# Patient Record
Sex: Female | Born: 1995 | Race: White | Hispanic: No | Marital: Single | Smoking: Never smoker
Health system: Southern US, Community
[De-identification: ages and names within clinical notes are randomized; demographics above are authoritative.]

## PROBLEM LIST (undated history)

## (undated) DIAGNOSIS — F32A Depression, unspecified: Secondary | ICD-10-CM

## (undated) DIAGNOSIS — F329 Major depressive disorder, single episode, unspecified: Secondary | ICD-10-CM

## (undated) DIAGNOSIS — R569 Unspecified convulsions: Secondary | ICD-10-CM

---
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
## 2017-03-01 ENCOUNTER — Emergency Department: Payer: 59

## 2017-03-01 ENCOUNTER — Observation Stay
Admission: EM | Admit: 2017-03-01 | Discharge: 2017-03-02 | Disposition: A | Discharge status: Home / Self Care | Payer: 59 | Attending: Internal Medicine | Admitting: Internal Medicine

## 2017-03-01 ENCOUNTER — Other Ambulatory Visit: Payer: Self-pay

## 2017-03-01 ENCOUNTER — Encounter: Payer: Self-pay | Admitting: Emergency Medicine

## 2017-03-01 DIAGNOSIS — F329 Major depressive disorder, single episode, unspecified: Secondary | ICD-10-CM | POA: Insufficient documentation

## 2017-03-01 DIAGNOSIS — R569 Unspecified convulsions: Secondary | ICD-10-CM | POA: Diagnosis not present

## 2017-03-01 DIAGNOSIS — S0990XA Unspecified injury of head, initial encounter: Secondary | ICD-10-CM | POA: Diagnosis not present

## 2017-03-01 DIAGNOSIS — Z72820 Sleep deprivation: Secondary | ICD-10-CM | POA: Diagnosis not present

## 2017-03-01 DIAGNOSIS — Z79899 Other long term (current) drug therapy: Secondary | ICD-10-CM | POA: Diagnosis not present

## 2017-03-01 DIAGNOSIS — Y9301 Activity, walking, marching and hiking: Secondary | ICD-10-CM | POA: Insufficient documentation

## 2017-03-01 HISTORY — DX: Unspecified convulsions: R56.9

## 2017-03-01 LAB — URINE DRUG SCREEN, QUALITATIVE (ARMC ONLY)
Amphetamines, Ur Screen: NOT DETECTED
BARBITURATES, UR SCREEN: NOT DETECTED
Benzodiazepine, Ur Scrn: NOT DETECTED
CANNABINOID 50 NG, UR ~~LOC~~: NOT DETECTED
COCAINE METABOLITE, UR ~~LOC~~: NOT DETECTED
MDMA (ECSTASY) UR SCREEN: NOT DETECTED
Methadone Scn, Ur: NOT DETECTED
OPIATE, UR SCREEN: NOT DETECTED
Phencyclidine (PCP) Ur S: NOT DETECTED
Tricyclic, Ur Screen: NOT DETECTED

## 2017-03-01 LAB — BASIC METABOLIC PANEL
ANION GAP: 12 (ref 5–15)
BUN: 10 mg/dL (ref 6–20)
CALCIUM: 9.2 mg/dL (ref 8.9–10.3)
CHLORIDE: 104 mmol/L (ref 101–111)
CO2: 21 mmol/L — AB (ref 22–32)
Creatinine, Ser: 0.8 mg/dL (ref 0.44–1.00)
GFR calc Af Amer: >60 mL/min (ref >60)
GFR calc non Af Amer: >60 mL/min (ref >60)
GLUCOSE: 102 mg/dL — AB (ref 65–99)
POTASSIUM: 3.9 mmol/L (ref 3.5–5.1)
Sodium: 137 mmol/L (ref 135–145)

## 2017-03-01 LAB — CBC
HEMATOCRIT: 37.1 % (ref 35.0–47.0)
HEMOGLOBIN: 12.9 g/dL (ref 12.0–16.0)
MCH: 32.1 pg (ref 26.0–34.0)
MCHC: 34.7 g/dL (ref 32.0–36.0)
MCV: 92.4 fL (ref 80.0–100.0)
Platelets: 242 10*3/uL (ref 150–440)
RBC: 4.02 MIL/uL (ref 3.80–5.20)
RDW: 12.9 % (ref 11.5–14.5)
WBC: 7.1 10*3/uL (ref 3.6–11.0)

## 2017-03-01 LAB — POCT PREGNANCY, URINE: Preg Test, Ur: NEGATIVE

## 2017-03-01 MED ORDER — FLUOXETINE HCL 20 MG PO CAPS
20.0000 mg | ORAL_CAPSULE | Freq: Every day | ORAL | Status: DC
  Administered 2017-03-02: 20 mg via ORAL
  Filled 2017-03-01 (×2): qty 1

## 2017-03-01 MED ORDER — LORAZEPAM 2 MG/ML IJ SOLN
2.0000 mg | INTRAMUSCULAR | Status: DC | PRN
  Administered 2017-03-01 – 2017-03-02 (×3): 2 mg via INTRAVENOUS
  Filled 2017-03-01 (×3): qty 1

## 2017-03-01 MED ORDER — ACETAMINOPHEN 325 MG PO TABS
650.0000 mg | ORAL_TABLET | Freq: Once | ORAL | Status: AC
  Administered 2017-03-01: 650 mg via ORAL
  Filled 2017-03-01: qty 2

## 2017-03-01 MED ORDER — GADOBENATE DIMEGLUMINE 529 MG/ML IV SOLN
10.0000 mL | Freq: Once | INTRAVENOUS | Status: AC | PRN
  Administered 2017-03-01: 10 mL via INTRAVENOUS

## 2017-03-01 MED ORDER — HEPARIN SODIUM (PORCINE) 5000 UNIT/ML IJ SOLN
5000.0000 [IU] | Freq: Three times a day (TID) | INTRAMUSCULAR | Status: DC
  Filled 2017-03-01: qty 1

## 2017-03-01 MED ORDER — ONDANSETRON 4 MG PO TBDP
4.0000 mg | ORAL_TABLET | Freq: Once | ORAL | Status: AC
  Administered 2017-03-01: 4 mg via ORAL
  Filled 2017-03-01: qty 1

## 2017-03-01 MED ORDER — DOCUSATE SODIUM 100 MG PO CAPS: 100.0000 mg | ORAL_CAPSULE | Freq: Two times a day (BID) | ORAL | Status: DC | PRN

## 2017-03-01 NOTE — ED Provider Notes (Signed)
[facility] Emergency Department Provider Note   ____________________________________________    I have reviewed the triage vital signs and the nursing notes.   HISTORY  Chief Complaint Seizures     HPI Victoria Dickerson is a 21 y.o. female who presents after a seizure.  Patient reports that she is under considerable stress with exams coming up and recently underwent a breakup with her boyfriend and apparently today had a seizure.  She reports she had a seizure several years ago also under similar stressful circumstances.  At that time mother reports she had a full workup which was unremarkable, patient is not on any antiepileptics.  Patient feels well at this time.  No nausea or vomiting.   History reviewed. No pertinent past medical history.  There are no active problems to display for this patient.   History reviewed. No pertinent surgical history.  Prior to Admission medications   Medication Sig Start Date End Date Taking? Authorizing Provider  cetirizine (ZYRTEC) 10 MG tablet Take 10 mg by mouth daily.   Yes [provider]  FLUoxetine (PROZAC) 20 MG capsule Take 20 mg by mouth daily.   Yes [provider]     Allergies Patient has no known allergies.  No family history on file.  Social History Social History   Tobacco Use  . Smoking status: Never Smoker  . Smokeless tobacco: Never Used  Substance Use Topics  . Alcohol use: Yes    Comment: every other weekend   . Drug use: No    Review of Systems  Constitutional: No fever/chills Eyes: No visual changes.  ENT: No neck pain Cardiovascular: Denies chest pain. Respiratory: Denies shortness of breath. Gastrointestinal: No abdominal pain.  No nausea, no vomiting.   Genitourinary: Negative for dysuria. Musculoskeletal: Negative for back pain. Skin: Negative for rash. Neurological: Negative for focal  weakness   ____________________________________________   PHYSICAL EXAM:  VITAL SIGNS: ED Triage Vitals  Enc Vitals Group     BP 03/01/17 1345 119/76     Pulse Rate 03/01/17 1341 (!) 106     Resp 03/01/17 1341 16     Temp 03/01/17 1341 97.6 F (36.4 C)     Temp Source 03/01/17 1341 Oral     SpO2 03/01/17 1341 98 %     Weight 03/01/17 1343 54.4 kg (120 lb)     Height 03/01/17 1343 1.702 m (5\' 7" )     Head Circumference --      Peak Flow --      Pain Score 03/01/17 1341 3     Pain Loc --      Pain Edu? --      Excl. in GC? --     Constitutional: Alert and oriented. No acute distress. Pleasant and interactive Eyes: Conjunctivae are normal.  Head: Hematoma left lateral forehead Nose: No congestion/rhinnorhea. Mouth/Throat: Mucous membranes are moist.   Neck:  Painless ROM, no vertebral tenderness to palpation Cardiovascular: Normal rate, regular rhythm. Grossly normal heart sounds.  Good peripheral circulation. Respiratory: Normal respiratory effort.  No retractions. Lungs CTAB. Gastrointestinal: Soft and nontender. No distention.  No CVA tenderness. Genitourinary: deferred Musculoskeletal: No lower extremity tenderness nor edema.  Warm and well perfused Neurologic:  Normal speech and language. No gross focal neurologic deficits are appreciated.  Skin:  Skin is warm, dry and intact. No rash noted. Psychiatric: Mood and affect are normal. Speech and behavior are normal.  ____________________________________________   LABS (all labs ordered are listed,  but only abnormal results are displayed)  Labs Reviewed  BASIC METABOLIC PANEL - Abnormal; Notable for the following components:      Result Value   CO2 21 (*)    Glucose, Bld 102 (*)    All other components within normal limits  CBC  URINE DRUG SCREEN, QUALITATIVE ([facility] ONLY)  POC URINE PREG, ED    ____________________________________________  EKG  None ____________________________________________  RADIOLOGY  CT head unremarkable ____________________________________________   PROCEDURES  Procedure(s) performed: No  Procedures   Critical Care performed: No ____________________________________________   INITIAL IMPRESSION / ASSESSMENT AND PLAN / ED COURSE  Pertinent labs & imaging results that were available during my care of the patient were reviewed by me and considered in my medical decision making (see chart for details).  Patient presents after seizure.  She is overall well-appearing and in no acute distress.  Vitals are unremarkable.  Labs are normal.  CT head is reassuring.  Consulted neurology Dr. Thad Rangereynolds who recommends observation in the hospital.  Will discuss with hospitalist for further management    ____________________________________________   FINAL CLINICAL IMPRESSION(S) / ED DIAGNOSES  Final diagnoses:  Seizure [facility](HCC)        Note:  This document was prepared using Dragon voice recognition software and may include unintentional dictation errors.    Jene EveryKinner, Nathan Moctezuma, MD 03/01/17 1504

## 2017-03-01 NOTE — H&P (Signed)
[employer] [facility] [facility] [place] [facility]   PATIENT NAME: Victoria Dickerson    MR#:  [id number]  DATE OF BIRTH:  [birth year]  DATE OF ADMISSION:  03/01/2017  PRIMARY CARE PHYSICIAN: Patient, No Pcp Per   REQUESTING/REFERRING PHYSICIAN: Kinner  CHIEF COMPLAINT:   Chief Complaint  Patient presents with  . Seizures    HISTORY OF PRESENT ILLNESS: Victoria Dickerson  is a 21 y.o. female with a known history of Seizure 4 yrs ago, no meds. At [place] in [place], today while walking had a seizure episode and a fall. Hit her head, no tongue biting or b/B incontinence. Denies any symptoms now. She did not sleep last 2 nights and had a few coffees.  PAST MEDICAL HISTORY:   Past Medical History:  Diagnosis Date  . Seizure (HCC)     PAST SURGICAL HISTORY: History reviewed. No pertinent surgical history.  SOCIAL HISTORY:  Social History   Tobacco Use  . Smoking status: Never Smoker  . Smokeless tobacco: Never Used  Substance Use Topics  . Alcohol use: Yes    Comment: every other weekend     FAMILY HISTORY: No family history on file.    She denies any medical issues in her parents. Does not know about her grand parents.  DRUG ALLERGIES: No Known Allergies  REVIEW OF SYSTEMS:   CONSTITUTIONAL: No fever, fatigue or weakness.  EYES: No blurred or double vision.  EARS, NOSE, AND THROAT: No tinnitus or ear pain.  RESPIRATORY: No cough, shortness of breath, wheezing or hemoptysis.  CARDIOVASCULAR: No chest pain, orthopnea, edema.  GASTROINTESTINAL: No nausea, vomiting, diarrhea or abdominal pain.  GENITOURINARY: No dysuria, hematuria.  ENDOCRINE: No polyuria, nocturia,  HEMATOLOGY: No anemia, easy bruising or bleeding SKIN: No rash or lesion. MUSCULOSKELETAL: No joint pain or arthritis.   NEUROLOGIC: No tingling, numbness, weakness.  PSYCHIATRY: No anxiety or depression.   MEDICATIONS AT HOME:  Prior to Admission medications   Medication Sig Start Date End Date Taking?  Authorizing Provider  cetirizine (ZYRTEC) 10 MG tablet Take 10 mg by mouth daily.   Yes [provider]  FLUoxetine (PROZAC) 20 MG capsule Take 20 mg by mouth daily.   Yes [provider]      PHYSICAL EXAMINATION:   VITAL SIGNS: Blood pressure 121/76, pulse (!) 104, temperature 97.6 F (36.4 C), temperature source Oral, resp. rate 19, height 5\' 7"  (1.702 m), weight 54.4 kg (120 lb), last menstrual period 02/13/2017, SpO2 98 %.  GENERAL:  21 y.o.-year-old patient lying in the bed with no acute distress.  EYES: Pupils equal, round, reactive to light and accommodation. No scleral icterus. Extraocular muscles intact.  HEENT: some bruise on left frontal head, normocephalic. Oropharynx and nasopharynx clear.  NECK:  Supple, no jugular venous distention. No thyroid enlargement, no tenderness.  LUNGS: Normal breath sounds bilaterally, no wheezing, rales,rhonchi or crepitation. No use of accessory muscles of respiration.  CARDIOVASCULAR: S1, S2 normal. No murmurs, rubs, or gallops.  ABDOMEN: Soft, nontender, nondistended. Bowel sounds present. No organomegaly or mass.  EXTREMITIES: No pedal edema, cyanosis, or clubbing.  NEUROLOGIC: Cranial nerves II through XII are intact. Muscle strength 5/5 in all extremities. Sensation intact. Gait not checked.  PSYCHIATRIC: The patient is alert and oriented x 3.  SKIN: No obvious rash, lesion, or ulcer.   LABORATORY PANEL:   CBC Recent Labs  Lab 03/01/17 1346  WBC 7.1  HGB 12.9  HCT 37.1  PLT 242  MCV 92.4  MCH 32.1  MCHC 34.7  RDW 12.9   ------------------------------------------------------------------------------------------------------------------  Chemistries  Recent Labs  Lab 03/01/17 1346  NA 137  K 3.9  CL 104  CO2 21*  GLUCOSE 102*  BUN 10  CREATININE 0.80  CALCIUM 9.2   ------------------------------------------------------------------------------------------------------------------ estimated creatinine  clearance is 97.1 mL/min (by C-G formula based on SCr of 0.8 mg/dL). ------------------------------------------------------------------------------------------------------------------ No results for input(s): TSH, T4TOTAL, T3FREE, THYROIDAB in the last 72 hours.  Invalid input(s): FREET3   Coagulation profile No results for input(s): INR, PROTIME in the last 168 hours. ------------------------------------------------------------------------------------------------------------------- No results for input(s): DDIMER in the last 72 hours. -------------------------------------------------------------------------------------------------------------------  Cardiac Enzymes No results for input(s): CKMB, TROPONINI, MYOGLOBIN in the last 168 hours.  Invalid input(s): CK ------------------------------------------------------------------------------------------------------------------ Invalid input(s): POCBNP  ---------------------------------------------------------------------------------------------------------------  Urinalysis No results found for: COLORURINE, APPEARANCEUR, LABSPEC, PHURINE, GLUCOSEU, HGBUR, BILIRUBINUR, KETONESUR, PROTEINUR, UROBILINOGEN, NITRITE, LEUKOCYTESUR   RADIOLOGY: Ct Head Wo Contrast  Result Date: 03/01/2017 CLINICAL DATA:  Seizure. EXAM: CT HEAD WITHOUT CONTRAST TECHNIQUE: Contiguous axial images were obtained from the base of the skull through the vertex without intravenous contrast. COMPARISON:  None. FINDINGS: Brain: No evidence of acute infarction, hemorrhage, hydrocephalus, extra-axial collection or mass lesion/mass effect. Vascular: No hyperdense vessel or unexpected calcification. Skull: Normal. Negative for fracture or focal lesion. Sinuses/Orbits: No acute finding. Other: None. IMPRESSION: Normal noncontrast head CT. Electronically Signed   By: Obie DredgeWilliam T Derry M.D.   On: 03/01/2017 14:45   Mr Laqueta JeanBrain W HYWo Contrast  Result Date: 03/01/2017 CLINICAL DATA:   Seizure. History of seizure 4 years ago. One seizure today with fall and head injury. EXAM: MRI HEAD WITHOUT AND WITH CONTRAST TECHNIQUE: Multiplanar, multiecho pulse sequences of the brain and surrounding structures were obtained without and with intravenous contrast. CONTRAST:  10mL MULTIHANCE GADOBENATE DIMEGLUMINE 529 MG/ML IV SOLN COMPARISON:  None. FINDINGS: Brain: No cortical finding to explain seizure. No mass, visible dysplasia, gliosis, or blood products. Hippocampus has a symmetric normal signal. Symmetric normal appearance of the fornix. No abnormal intracranial enhancement. There is superior convexity of the pituitary gland which is normal for demographics. No underlying nodular enhancement and the gland measures normal size, with appearance accentuated by somewhat shallow sella. Vascular: Small developmental venous anomaly along the anterior right insula. Major flow voids and vascular enhancements are preserved. Skull and upper cervical spine: Negative Sinuses/Orbits: Small volume trapped secretions or arrested pneumatization of air cells in the right petrous apex. No restricted diffusion or expansion. IMPRESSION: Negative brain MRI.  No explanation for seizure. Electronically Signed   By: Marnee SpringJonathon  Watts M.D.   On: 03/01/2017 19:13    EKG: No orders found for this or any previous visit.  IMPRESSION AND PLAN:  * Seizure   Likely due to sleep deprivation   Seen by neuro.   EEG,  MRI   PRN Ativan.   Advised about no driving.   All the records are reviewed and case discussed with ED provider. Management plans discussed with the patient, family and they are in agreement.  CODE STATUS: Full. Code Status History    This patient does not have a recorded code status. Please follow your organizational policy for patients in this situation.       TOTAL TIME TAKING CARE OF THIS PATIENT: 45 minutes.    Altamese DillingVaibhavkumar Javious Hallisey M.D on 03/01/2017   Between 7am to 6pm - Pager -  [phone]  After 6pm go to [email or web address] - Social research officer, governmentpassword EPAS [facility]  [facility]  Office  [phone]  CC: Primary care physician;  Patient, No Pcp Per   Note: This dictation was prepared with Dragon dictation along with smaller phrase technology. Any transcriptional errors that result from this process are unintentional.

## 2017-03-01 NOTE — ED Notes (Signed)
Patient transported to CT 

## 2017-03-01 NOTE — ED Notes (Signed)
MD Reynolds at bedside. 

## 2017-03-01 NOTE — ED Triage Notes (Addendum)
Pt to ED via EMS from school where pt had seizure like activity. Pt had fall, brusing noted to forehead. Pt  has hx of anxiety seizures, currently takes Prozac.  pt states increased stress and decreased sleep. Pt is A&OX4, speech clear. VS stable

## 2017-03-01 NOTE — ED Notes (Signed)
Pt transported to EEG 

## 2017-03-01 NOTE — Progress Notes (Signed)
Dr. Anne HahnWillis notified of pts. Concern if she gets upset again she will have a seizure, and he ordered for IV ativan to be administered.

## 2017-03-01 NOTE — Consult Note (Signed)
Reason for Consult:Seizure Referring Physician: Cyril LoosenKinner  CC: Seizure  HPI: Victoria Dickerson is an 21 y.o. female with a history of seizure 4 years ago (work up unremarkable, patient not placed on anticonvulsant therapy) who wa at school today.  Reports starting to feel unusual.  Unable to give much history beyond that.  Was tod that she fell and started to have a generalized seizure.  There was no tongue biting or B/B incontinence.    PMH: Seizure 4 years ago, sinusitis  History reviewed. No pertinent surgical history.  Family history: No family history of seizures. Mother alive and well.   Social History:  reports that  has never smoked. she has never used smokeless tobacco. She reports that she drinks alcohol. She reports that she does not use drugs.  No Known Allergies  Medications: I have reviewed the patient's current medications. Prior to Admission:  Prior to Admission medications   Medication Sig Start Date End Date Taking? Authorizing Provider  cetirizine (ZYRTEC) 10 MG tablet Take 10 mg by mouth daily.   Yes [provider]  FLUoxetine (PROZAC) 20 MG capsule Take 20 mg by mouth daily.   Yes [provider]    ROS: History obtained from the patient  General ROS: loss of sleep Psychological ROS: recent stressors Ophthalmic ROS: negative for - blurry vision, double vision, eye pain or loss of vision ENT ROS: negative for - epistaxis, nasal discharge, oral lesions, sore throat, tinnitus or vertigo Allergy and Immunology ROS: negative for - hives or itchy/watery eyes Hematological and Lymphatic ROS: negative for - bleeding problems, bruising or swollen lymph nodes Endocrine ROS: negative for - galactorrhea, hair pattern changes, polydipsia/polyuria or temperature intolerance Respiratory ROS: negative for - cough, hemoptysis, shortness of breath or wheezing Cardiovascular ROS: negative for - chest pain, dyspnea on exertion, edema or irregular  heartbeat Gastrointestinal ROS: negative for - abdominal pain, diarrhea, hematemesis, nausea/vomiting or stool incontinence Genito-Urinary ROS: negative for - dysuria, hematuria, incontinence or urinary frequency/urgency Musculoskeletal ROS: negative for - joint swelling or muscular weakness Neurological ROS: as noted in HPI Dermatological ROS: negative for rash and skin lesion changes  Physical Examination: Blood pressure 101/66, pulse (!) 112, temperature 97.6 F (36.4 C), temperature source Oral, resp. rate 18, height 5\' 7"  (1.702 m), weight 54.4 kg (120 lb), last menstrual period 02/13/2017, SpO2 98 %.  HEENT-  Normocephalic, abrasion on left forehead.  Normal external eye and conjunctiva.  Normal TM's bilaterally.  Normal auditory canals and external ears. Normal external nose, mucus membranes and septum.  Normal pharynx. Cardiovascular- S1, S2 normal, pulses palpable throughout   Lungs- chest clear, no wheezing, rales, normal symmetric air entry Abdomen- soft, non-tender; bowel sounds normal; no masses,  no organomegaly Extremities- no edema Lymph-no adenopathy palpable Musculoskeletal-no joint tenderness, deformity or swelling Skin-warm and dry, no hyperpigmentation, vitiligo, or suspicious lesions  Neurological Examination   Mental Status: Alert, oriented, thought content appropriate.  Speech fluent without evidence of aphasia.  Able to follow 3 step commands without difficulty. Cranial Nerves: II: Discs flat bilaterally; Visual fields grossly normal, pupils equal, round, reactive to light and accommodation III,IV, VI: ptosis not present, extra-ocular motions intact bilaterally V,VII: smile symmetric, facial light touch sensation normal bilaterally VIII: hearing normal bilaterally IX,X: gag reflex present XI: bilateral shoulder shrug XII: midline tongue extension Motor: Right : Upper extremity   5/5    Left:     Upper extremity   5/5  Lower extremity   5/5  Lower extremity    5/5 Tone and bulk:normal tone throughout; no atrophy noted Sensory: Pinprick and light touch intact throughout, bilaterally Deep Tendon Reflexes: 2+ and symmetric throughout Plantars: Right: downgoing   Left: downgoing Cerebellar: Normal finger-to-nose, normal rapid alternating movements and normal heel-to-shin testing bilaterally Gait: not tested due to safety concerns   Laboratory Studies:   Basic Metabolic Panel: Recent Labs  Lab 03/01/17 1346  NA 137  K 3.9  CL 104  CO2 21*  GLUCOSE 102*  BUN 10  CREATININE 0.80  CALCIUM 9.2    Liver Function Tests: No results for input(s): AST, ALT, ALKPHOS, BILITOT, PROT, ALBUMIN in the last 168 hours. No results for input(s): LIPASE, AMYLASE in the last 168 hours. No results for input(s): AMMONIA in the last 168 hours.  CBC: Recent Labs  Lab 03/01/17 1346  WBC 7.1  HGB 12.9  HCT 37.1  MCV 92.4  PLT 242    Cardiac Enzymes: No results for input(s): CKTOTAL, CKMB, CKMBINDEX, TROPONINI in the last 168 hours.  BNP: Invalid input(s): POCBNP  CBG: No results for input(s): GLUCAP in the last 168 hours.  Microbiology: No results found for this or any previous visit.  Coagulation Studies: No results for input(s): LABPROT, INR in the last 72 hours.  Urinalysis: No results for input(s): COLORURINE, LABSPEC, PHURINE, GLUCOSEU, HGBUR, BILIRUBINUR, KETONESUR, PROTEINUR, UROBILINOGEN, NITRITE, LEUKOCYTESUR in the last 168 hours.  Invalid input(s): APPERANCEUR  Lipid Panel:  No results found for: CHOL, TRIG, HDL, CHOLHDL, VLDL, LDLCALC  HgbA1C: No results found for: HGBA1C  Urine Drug Screen:  No results found for: LABOPIA, COCAINSCRNUR, LABBENZ, AMPHETMU, THCU, LABBARB  Alcohol Level: No results for input(s): ETH in the last 168 hours.   Imaging: Ct Head Wo Contrast  Result Date: 03/01/2017 CLINICAL DATA:  Seizure. EXAM: CT HEAD WITHOUT CONTRAST TECHNIQUE: Contiguous axial images were obtained from the base of the  skull through the vertex without intravenous contrast. COMPARISON:  None. FINDINGS: Brain: No evidence of acute infarction, hemorrhage, hydrocephalus, extra-axial collection or mass lesion/mass effect. Vascular: No hyperdense vessel or unexpected calcification. Skull: Normal. Negative for fracture or focal lesion. Sinuses/Orbits: No acute finding. Other: None. IMPRESSION: Normal noncontrast head CT. Electronically Signed   By: Obie DredgeWilliam T Derry M.D.   On: 03/01/2017 14:45     Assessment/Plan: 21 year old female presenting after recurrent seizure.  First and only previous seizure about 4 years ago.  Patient with stressors at that time as well.  Work up unremarkable.  With prolonged work up since last event and head injury, further work up recommended.  Head CT reviewed and shows no acute changes.  UDS unremarkable.    Recommendations: 1.  Anticonvulsant therapy not indicated at this time   2.  EEG 3.  MRI of the brain with and without contrast 4.  Seizure precautions 5.  Telemetry monitoring 6.  Patient unable to drive, operate heavy machinery, perform activities at heights and participate in water activities until release by outpatient physician.  Case discussed with Dr. Geronimo BootKinner  Sheila Gervasi, MD Neurology [phone] 03/01/2017, 3:01 PM

## 2017-03-02 ENCOUNTER — Other Ambulatory Visit: Payer: Self-pay

## 2017-03-02 LAB — BASIC METABOLIC PANEL
Anion gap: 8 (ref 5–15)
BUN: 8 mg/dL (ref 6–20)
CHLORIDE: 105 mmol/L (ref 101–111)
CO2: 22 mmol/L (ref 22–32)
CREATININE: 0.52 mg/dL (ref 0.44–1.00)
Calcium: 9.1 mg/dL (ref 8.9–10.3)
GFR calc non Af Amer: >60 mL/min (ref >60)
Glucose, Bld: 98 mg/dL (ref 65–99)
POTASSIUM: 3.8 mmol/L (ref 3.5–5.1)
Sodium: 135 mmol/L (ref 135–145)

## 2017-03-02 LAB — CBC
HEMATOCRIT: 37.9 % (ref 35.0–47.0)
Hemoglobin: 12.6 g/dL (ref 12.0–16.0)
MCH: 31.3 pg (ref 26.0–34.0)
MCHC: 33.3 g/dL (ref 32.0–36.0)
MCV: 94.2 fL (ref 80.0–100.0)
PLATELETS: 227 10*3/uL (ref 150–440)
RBC: 4.02 MIL/uL (ref 3.80–5.20)
RDW: 13.2 % (ref 11.5–14.5)
WBC: 6.3 10*3/uL (ref 3.6–11.0)

## 2017-03-02 MED ORDER — ACETAMINOPHEN 325 MG PO TABS
650.0000 mg | ORAL_TABLET | Freq: Four times a day (QID) | ORAL | Status: DC | PRN
  Administered 2017-03-02: 650 mg via ORAL
  Filled 2017-03-02: qty 2

## 2017-03-02 NOTE — Discharge Summary (Signed)
[facility] - [place] at [facility]   PATIENT NAME: Victoria Dickerson    MR#:  [id number]  DATE OF BIRTH:  06/13/1995  DATE OF ADMISSION:  03/01/2017 ADMITTING PHYSICIAN: Altamese DillingVaibhavkumar Vachhani, MD  DATE OF DISCHARGE: 03/02/2017 10:50 AM  PRIMARY CARE PHYSICIAN: Dr Andrey SpearmanArchinal    ADMISSION DIAGNOSIS:  Seizure (HCC) [R56.9]  DISCHARGE DIAGNOSIS:  Active Problems:   Seizure (HCC)   SECONDARY DIAGNOSIS:   Past Medical History:  Diagnosis Date  . Seizure [facility](HCC)     HOSPITAL COURSE:   1.   Seizure.  Patient seen in consultation by neurology.  MRI of the brain with and without contrast was negative.  EEG showed no active seizures and was normal.  Urine drug screen was negative.  The patient states that she normally sleeps very well but recently got a cat that has been keeping her up at night.  Neurology felt that the seizure was brought on secondary to sleep deprivation and no medications at this time were indicated.  Patient has had some stressors.  Patient was advised not to drive or operate heavy machinery until cleared by neurology.  Staff has set up a follow-up appointment with Dr. Sherryll BurgerShah neurology [facility]. 2.  Depression on Prozac.  No thoughts of hurting herself or other people.  DISCHARGE CONDITIONS:   Satisfactory  CONSULTS OBTAINED:  Treatment Team:  [facility], Neuro1, MD Thana Farreynolds, Leslie, MD  DRUG ALLERGIES:  No Known Allergies  DISCHARGE MEDICATIONS:   Allergies as of 03/02/2017   No Known Allergies     Medication List    TAKE these medications   cetirizine 10 MG tablet Commonly known as:  ZYRTEC Take 10 mg by mouth daily.   FLUoxetine 20 MG capsule Commonly known as:  PROZAC Take 20 mg by mouth daily.        DISCHARGE INSTRUCTIONS:   Follow-up with [facility] 1 week Follow-up with neurology as outpatient  If you experience worsening of your admission symptoms, develop shortness of breath, life threatening  emergency, suicidal or homicidal thoughts you must seek medical attention immediately by calling 911 or calling your MD immediately  if symptoms less severe.  You Must read complete instructions/literature along with all the possible adverse reactions/side effects for all the Medicines you take and that have been prescribed to you. Take any new Medicines after you have completely understood and accept all the possible adverse reactions/side effects.   Please note  You were cared for by a hospitalist during your hospital stay. If you have any questions about your discharge medications or the care you received while you were in the hospital after you are discharged, you can call the unit and asked to speak with the hospitalist on call if the hospitalist that took care of you is not available. Once you are discharged, your primary care physician will handle any further medical issues. Please note that NO REFILLS for any discharge medications will be authorized once you are discharged, as it is imperative that you return to your primary care physician (or establish a relationship with a primary care physician if you do not have one) for your aftercare needs so that they can reassess your need for medications and monitor your lab values.    Today   CHIEF COMPLAINT:   Chief Complaint  Patient presents with  . Seizures    HISTORY OF PRESENT ILLNESS:  Victoria Dickerson  is a 21 y.o. female presented after a seizure   VITAL  SIGNS:  Blood pressure 116/67, pulse 91, temperature 98.4 F (36.9 C), temperature source Oral, resp. rate 18, height 5\' 7"  (1.702 m), weight 54.7 kg (120 lb 9.6 oz), last menstrual period 02/13/2017, SpO2 98 %.   PHYSICAL EXAMINATION:  GENERAL:  21 y.o.-year-old patient lying in the bed with no acute distress.  EYES: Pupils equal, round, reactive to light and accommodation. No scleral icterus. Extraocular muscles intact.  HEENT: Head atraumatic, normocephalic. Oropharynx and  nasopharynx clear.  NECK:  Supple, no jugular venous distention. No thyroid enlargement, no tenderness.  LUNGS: Normal breath sounds bilaterally, no wheezing, rales,rhonchi or crepitation. No use of accessory muscles of respiration.  CARDIOVASCULAR: S1, S2 normal. No murmurs, rubs, or gallops.  ABDOMEN: Soft, non-tender, non-distended. Bowel sounds present. No organomegaly or mass.  EXTREMITIES: No pedal edema, cyanosis, or clubbing.  NEUROLOGIC: Cranial nerves II through XII are intact. Muscle strength 5/5 in all extremities. Sensation intact. Gait not checked.  PSYCHIATRIC: The patient is alert and oriented x 3.  SKIN: No obvious rash, lesion, or ulcer.   Patient's nurse Victorino Dike was present for my interview and exam. DATA REVIEW:   CBC Recent Labs  Lab 03/02/17 0812  WBC 6.3  HGB 12.6  HCT 37.9  PLT 227    Chemistries  Recent Labs  Lab 03/02/17 0812  NA 135  K 3.8  CL 105  CO2 22  GLUCOSE 98  BUN 8  CREATININE 0.52  CALCIUM 9.1      RADIOLOGY:  Ct Head Wo Contrast  Result Date: 03/01/2017 CLINICAL DATA:  Seizure. EXAM: CT HEAD WITHOUT CONTRAST TECHNIQUE: Contiguous axial images were obtained from the base of the skull through the vertex without intravenous contrast. COMPARISON:  None. FINDINGS: Brain: No evidence of acute infarction, hemorrhage, hydrocephalus, extra-axial collection or mass lesion/mass effect. Vascular: No hyperdense vessel or unexpected calcification. Skull: Normal. Negative for fracture or focal lesion. Sinuses/Orbits: No acute finding. Other: None. IMPRESSION: Normal noncontrast head CT. Electronically Signed   By: Obie Dredge M.D.   On: 03/01/2017 14:45   Mr Laqueta Jean ZO Contrast  Result Date: 03/01/2017 CLINICAL DATA:  Seizure. History of seizure 4 years ago. One seizure today with fall and head injury. EXAM: MRI HEAD WITHOUT AND WITH CONTRAST TECHNIQUE: Multiplanar, multiecho pulse sequences of the brain and surrounding structures were  obtained without and with intravenous contrast. CONTRAST:  10mL MULTIHANCE GADOBENATE DIMEGLUMINE 529 MG/ML IV SOLN COMPARISON:  None. FINDINGS: Brain: No cortical finding to explain seizure. No mass, visible dysplasia, gliosis, or blood products. Hippocampus has a symmetric normal signal. Symmetric normal appearance of the fornix. No abnormal intracranial enhancement. There is superior convexity of the pituitary gland which is normal for demographics. No underlying nodular enhancement and the gland measures normal size, with appearance accentuated by somewhat shallow sella. Vascular: Small developmental venous anomaly along the anterior right insula. Major flow voids and vascular enhancements are preserved. Skull and upper cervical spine: Negative Sinuses/Orbits: Small volume trapped secretions or arrested pneumatization of air cells in the right petrous apex. No restricted diffusion or expansion. IMPRESSION: Negative brain MRI.  No explanation for seizure. Electronically Signed   By: Marnee Spring M.D.   On: 03/01/2017 19:13      Management plans discussed with the patient, family (patients brother) and they are in agreement.  CODE STATUS:  Code Status History    Date Active Date Inactive Code Status Order ID Comments User Context   03/01/2017 21:37 03/02/2017 13:56 Full Code [id number]  Altamese Dilling,  MD Inpatient      TOTAL TIME TAKING CARE OF THIS PATIENT: 35 minutes.    Alford Highlandichard Ralph Benavidez M.D on 03/02/2017 at 4:49 PM  Between 7am to 6pm - Pager - [phone]  After 6pm go to [email or web address] - password [email or web address]  [facility] Office  [phone]  CC: Primary care physician; Dr Andrey SpearmanArchinal

## 2017-03-02 NOTE — Discharge Instructions (Signed)
° °  Seizure, Adult When you have a seizure:  Parts of your body may move.  How aware or awake (conscious) you are may change.  You may shake (convulse).  Some people have symptoms right before a seizure happens. These symptoms may include:  Fear.  Worry (anxiety).  Feeling like you are going to throw up (nausea).  Feeling like the room is spinning (vertigo).  Feeling like you saw or heard something before (deja vu).  Odd tastes or smells.  Changes in vision, such as seeing flashing lights or spots.  Seizures usually last from 30 seconds to 2 minutes. Usually, they are not harmful unless they last a long time. Follow these instructions at home: Medicines  Take over-the-counter and prescription medicines only as told by your doctor.  Avoid anything that may keep your medicine from working, such as alcohol. Activity  Do not do any activities that would be dangerous if you had another seizure, like driving or swimming. Wait until your doctor approves.  If you live in the [place] ask your local DMV (department of motor vehicles) when you can drive.  Rest. Teaching others  Teach friends and family what to do when you have a seizure. They should: ? Lay you on the ground. ? Protect your head and body. ? Loosen any tight clothing around your neck. ? Turn you on your side. ? Stay with you until you are better. ? Not hold you down. ? Not put anything in your mouth. ? Know whether or not you need emergency care. General instructions  Contact your doctor each time you have a seizure.  Avoid anything that gives you seizures.  Keep a seizure diary. Write down: ? What you think caused each seizure. ? What you remember about each seizure.  Keep all follow-up visits as told by your doctor. This is important. Contact a doctor if:  You have another seizure.  You have seizures more often.  There is any change in what happens during your seizures.  You continue to have  seizures with treatment.  You have symptoms of being sick or having an infection. Get help right away if:  You have a seizure: ? That lasts longer than 5 minutes. ? That is different than seizures you had before. ? That makes it harder to breathe. ? After you hurt your head.  After a seizure, you cannot speak or use a part of your body.  After a seizure, you are confused or have a bad headache.  You have two or more seizures in a row.  You are having seizures more often.  You do not wake up right after a seizure.  You get hurt during a seizure. In an emergency:  These symptoms may be an emergency. Do not wait to see if the symptoms will go away. Get medical help right away. Call your local emergency services (911 in the [place] Do not drive yourself to the hospital. This information is not intended to replace advice given to you by your health care provider. Make sure you discuss any questions you have with your health care provider. Document Released: 09/06/2007 Document Revised: 12/01/2015 Document Reviewed: 12/01/2015 Elsevier Interactive Patient Education  2017 Elsevier Inc. No Driving until cleared by neurologist as outpatient or Seizure free for six months

## 2017-03-02 NOTE — Progress Notes (Signed)
Patient ID: Victoria Dickerson, female   DOB: 1995/09/16, 21 y.o.   MRN: [id number] [facility] - [place] at [facility]        Victoria Dickerson was admitted to the Hospital on 03/01/2017 and Discharged  03/02/2017 and should be excused from work/school   for 2 days starting 03/01/2017 , may return to work/school without any restrictions.  Alford Highlandichard Burnette Valenti M.D on 03/02/2017,at 9:19 AM  [facility] - [place] at [facility]    Office  [phone]

## 2017-03-02 NOTE — Progress Notes (Signed)
Reviewed discharge summary with verbal understanding. Answered all questions.

## 2017-03-03 LAB — HIV ANTIBODY (ROUTINE TESTING W REFLEX): HIV SCREEN 4TH GENERATION: NONREACTIVE

## 2017-05-05 DIAGNOSIS — F3132 Bipolar disorder, current episode depressed, moderate: Secondary | ICD-10-CM | POA: Insufficient documentation

## 2017-05-05 DIAGNOSIS — F3164 Bipolar disorder, current episode mixed, severe, with psychotic features: Secondary | ICD-10-CM | POA: Insufficient documentation

## 2017-05-31 DIAGNOSIS — F315 Bipolar disorder, current episode depressed, severe, with psychotic features: Secondary | ICD-10-CM | POA: Insufficient documentation

## 2017-05-31 DIAGNOSIS — R45851 Suicidal ideations: Secondary | ICD-10-CM | POA: Insufficient documentation

## 2017-05-31 DIAGNOSIS — F329 Major depressive disorder, single episode, unspecified: Secondary | ICD-10-CM | POA: Insufficient documentation

## 2018-07-25 IMAGING — CT CT HEAD W/O CM
3 series · 15 of 47 positions shown, 18 images · non-contrast
Comparison: None.

CLINICAL DATA: Seizure.

EXAM:
CT HEAD WITHOUT CONTRAST
TECHNIQUE: Contiguous axial images were obtained from the base of the skull
through the vertex without intravenous contrast.

[Series 2: head wo · axial · 0.46mm/px · z∈[-150,-25]mm · 9 of 30 slices shown, 12 images]
[im 3/30  brain]
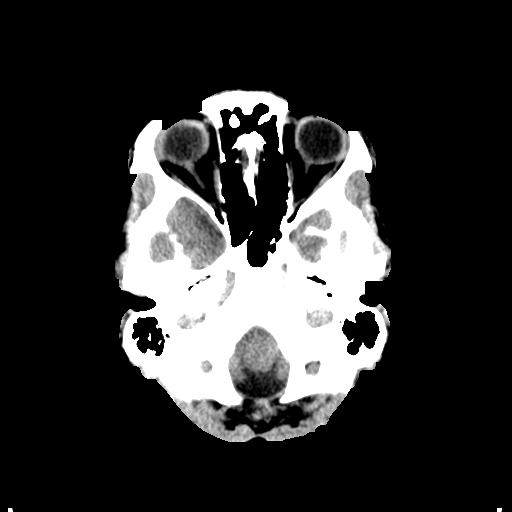
[im 3/30  bone]
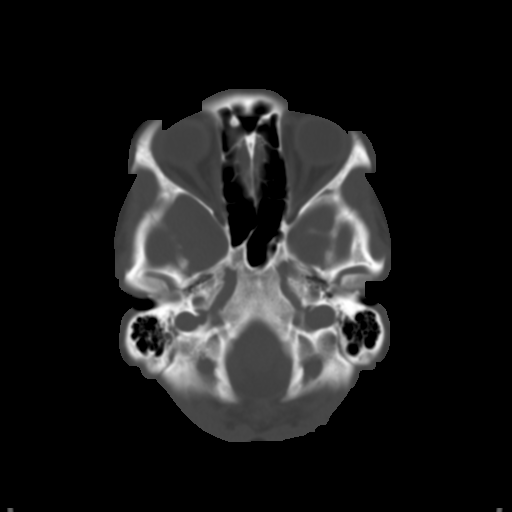
[im 6/30  brain]
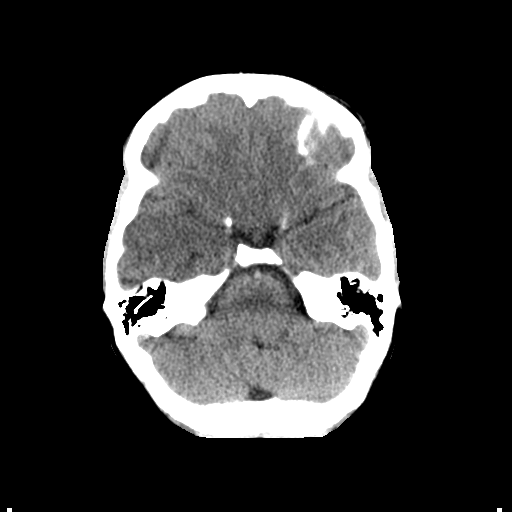
[im 9/30  brain]
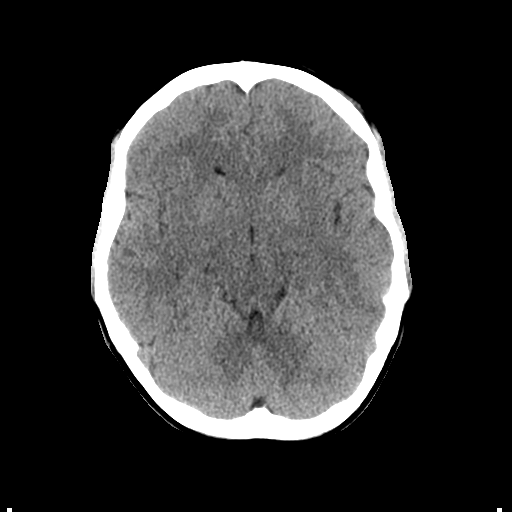
[im 12/30  brain]
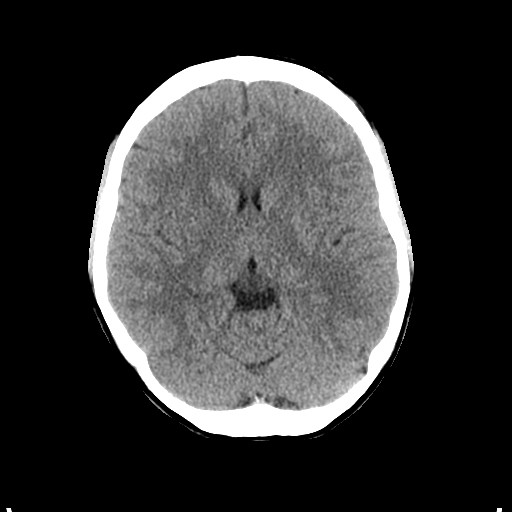
[im 16/30  brain]
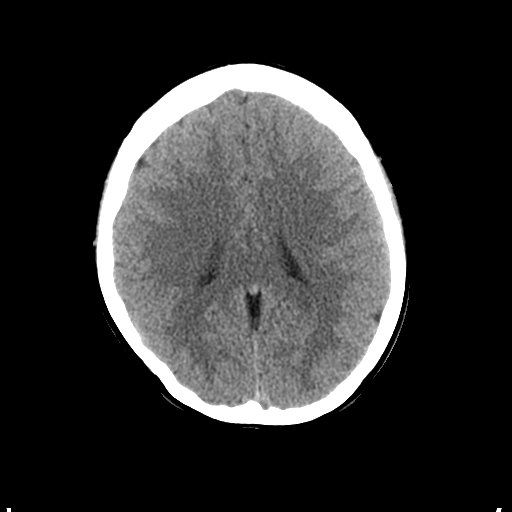
[im 16/30  bone]
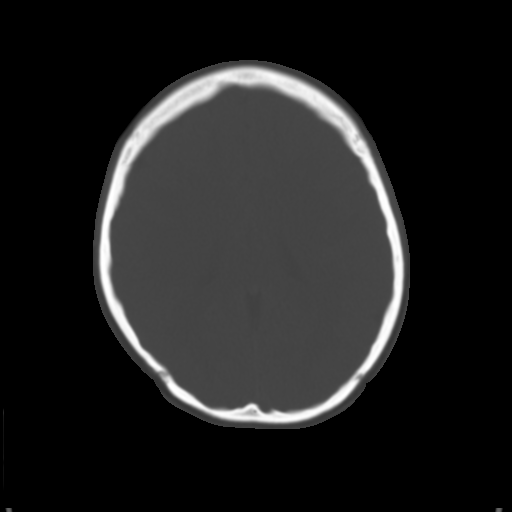
[im 19/30  brain]
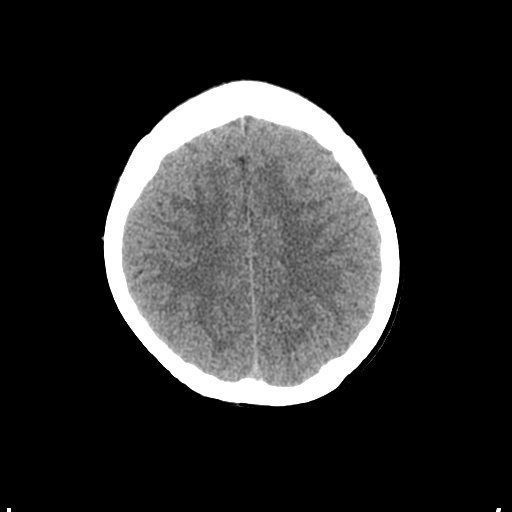
[im 22/30  brain]
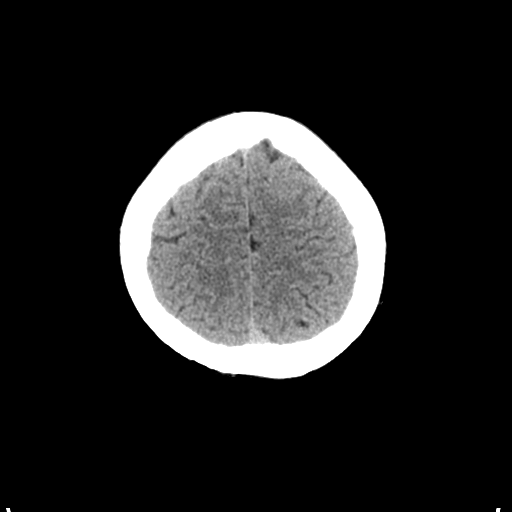
[im 25/30  brain]
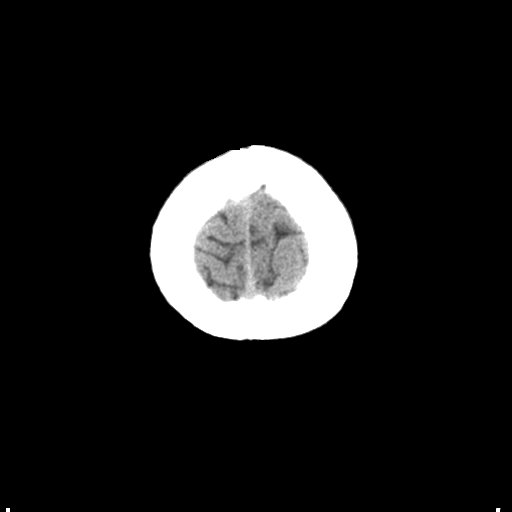
[im 28/30  brain]
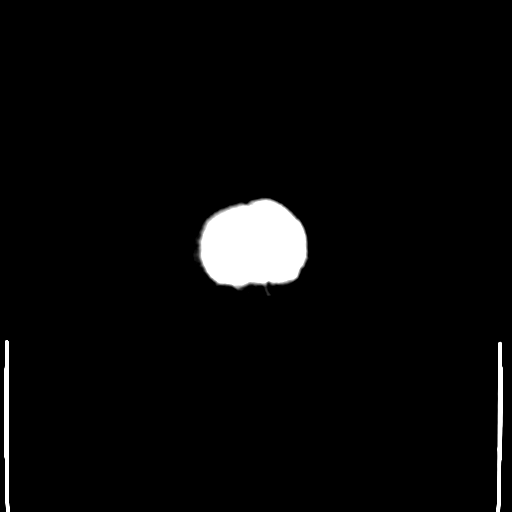
[im 28/30  bone]
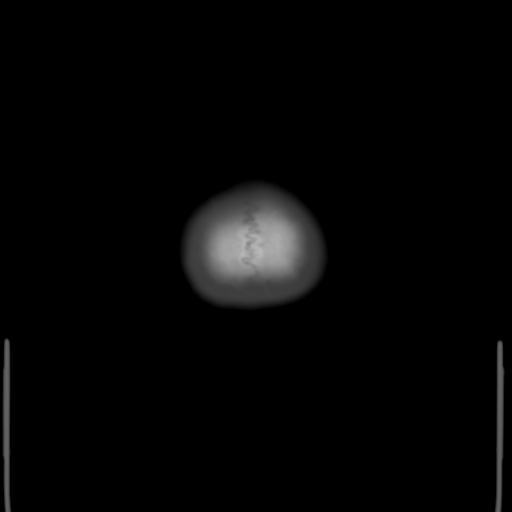

[Series 4: coronal soft tissue · coronal · 0.27mm/px · 3 of 59 slices shown]
[im 20/59  brain]
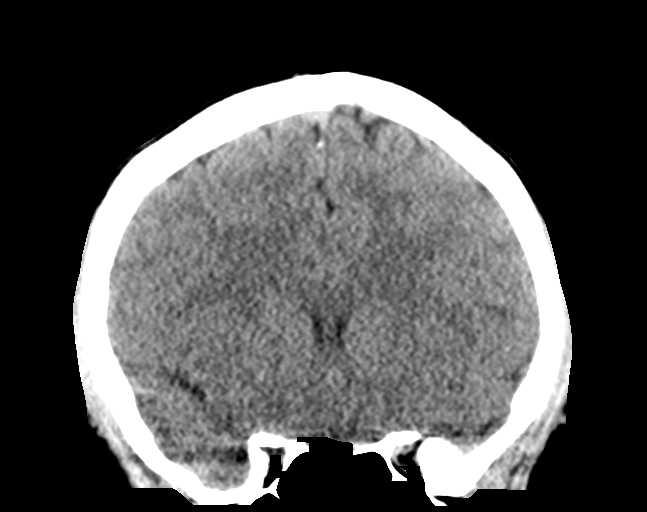
[im 26/59  brain]
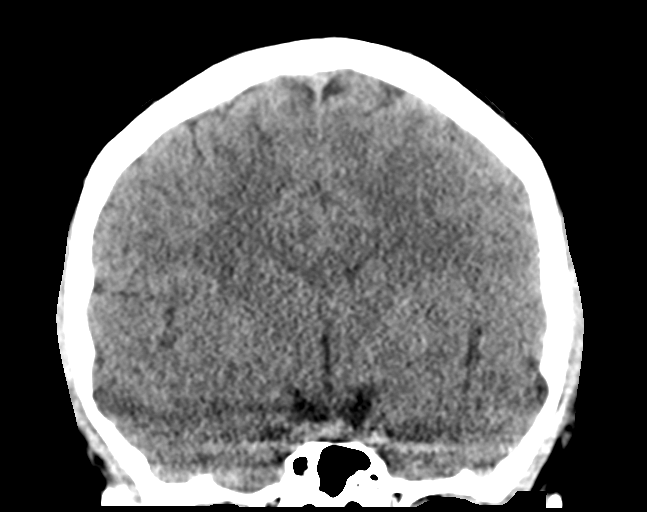
[im 33/59  brain]
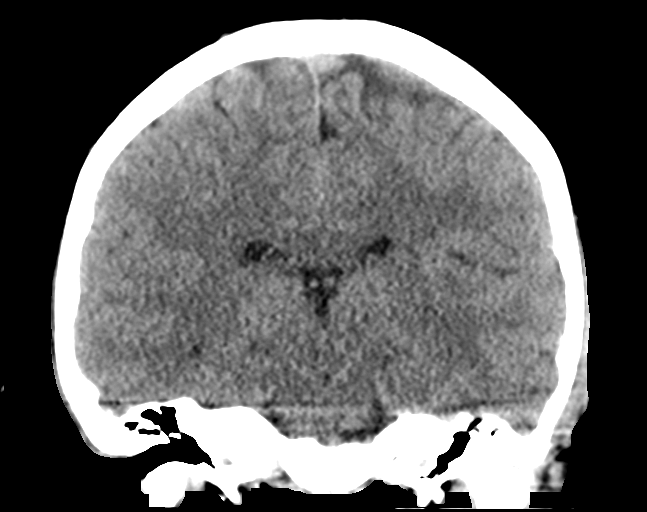

[Series 5: sagittal soft tissue · sagittal · 0.27mm/px · 3 of 49 slices shown]
[im 17/49  brain]
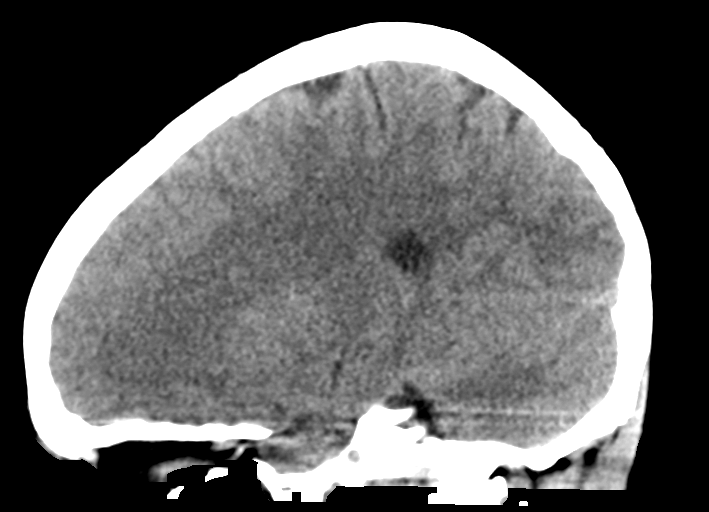
[im 25/49  brain]
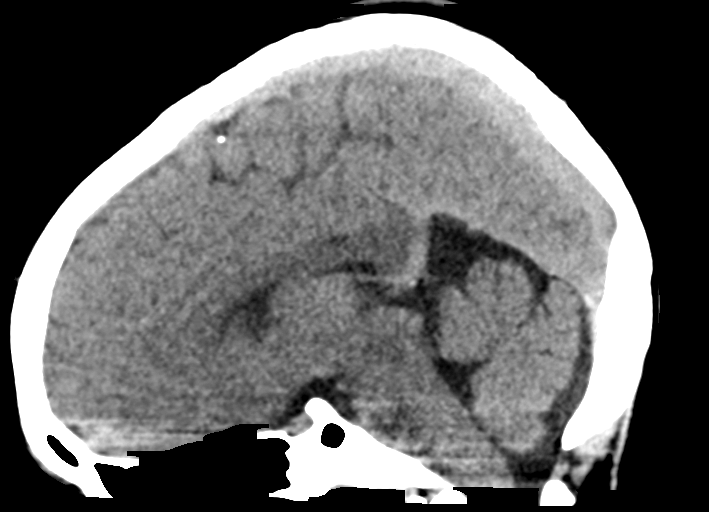
[im 33/49  brain]
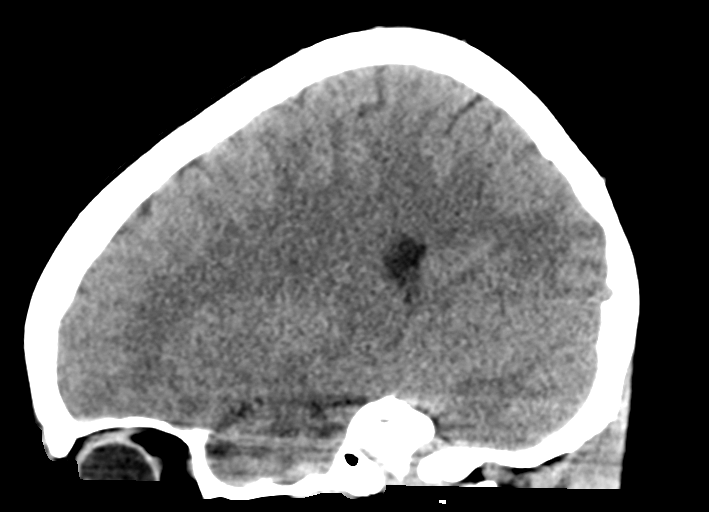

[15 of 47 positions shown; findings below may reference images not displayed]

FINDINGS: Brain: No evidence of acute infarction, hemorrhage, hydrocephalus,
extra-axial collection or mass lesion/mass effect.

Vascular: No hyperdense vessel or unexpected calcification.

Skull: Normal. Negative for fracture or focal lesion.

Sinuses/Orbits: No acute finding.

Other: None.
IMPRESSION: Normal noncontrast head CT.

## 2018-09-11 LAB — HM HIV SCREENING LAB: HM HIV Screening: NEGATIVE

## 2018-12-06 ENCOUNTER — Ambulatory Visit (INDEPENDENT_AMBULATORY_CARE_PROVIDER_SITE_OTHER)
Admission: RE | Admit: 2018-12-06 | Discharge: 2018-12-06 | Disposition: A | Discharge status: Home / Self Care | Payer: 59 | Source: Ambulatory Visit

## 2018-12-06 ENCOUNTER — Other Ambulatory Visit: Payer: Self-pay | Admitting: Internal Medicine

## 2018-12-06 DIAGNOSIS — J069 Acute upper respiratory infection, unspecified: Secondary | ICD-10-CM

## 2018-12-06 DIAGNOSIS — Z20828 Contact with and (suspected) exposure to other viral communicable diseases: Secondary | ICD-10-CM

## 2018-12-06 DIAGNOSIS — Z20822 Contact with and (suspected) exposure to covid-19: Secondary | ICD-10-CM

## 2018-12-06 NOTE — ED Provider Notes (Signed)
Virtual Visit via Video Note:  Victoria Dickerson  initiated request for Telemedicine visit with [facility] Urgent Care team. I connected with Victoria Dickerson  on 12/06/2018 at 10:58 AM  for a synchronized telemedicine visit using a video enabled HIPPA compliant telemedicine application. I verified that I am speaking with Victoria Dickerson  using two identifiers. Zigmund Gottron, NP  was physically located in a [facility] Urgent care site and Victoria Dickerson was located at a different location.   The limitations of evaluation and management by telemedicine as well as the availability of in-person appointments were discussed. Patient was informed that she  may incur a bill ( including co-pay) for this virtual visit encounter. Victoria Dickerson  expressed understanding and gave verbal consent to proceed with virtual visit.     History of Present Illness:Victoria Dickerson  is a 23 y.o. female presents with concerns about potential covid testing. She was around someone who was around someone else who had been in close contact with someone with covid. Working and going to school. She endorses runny nose, scratchy throat. Fatigue. Feeling like shes having a harder time breathing than normal. Some am cough. No fevers. Occasionally cold. Headaches intermittently. She has had her symptoms since 9/1. She feels she may have had other exposures, but no specific known. No medications for symptoms. History  Of epilepsy, she is on depakote. No allergies. Diarrhea lately, which has been normal for her, no new change.   Past Medical History:  Diagnosis Date  . Seizure ([place])     No Known Allergies      Observations/Objective: Alert, oriented, non toxic in appearance. Clear coherent speech without difficulty. No increased work of breathing visualized.  No cough or obvious nasal drainage assesed  Assessment and Plan: supportive cares for likely viral illness. covid testing ordered. encouraged isolation until results.   Follow Up  Instructions:    I discussed the assessment and treatment plan with the patient. The patient was provided an opportunity to ask questions and all were answered. The patient agreed with the plan and demonstrated an understanding of the instructions.   The patient was advised to call back or seek an in-person evaluation if the symptoms worsen or if the condition fails to improve as anticipated.  I provided 15 minutes of non-face-to-face time during this encounter.    Zigmund Gottron, NP  12/06/2018 10:58 AM         Zigmund Gottron, NP 12/06/18 1100

## 2018-12-06 NOTE — Discharge Instructions (Signed)
You may go [facility] to [facility] your [facility]. [place] Self isolate until results are back and negative.  Will notify you of any positive findings. You may monitor your results on your MyChart online as well.    May use over the counter medications as needed for symptoms.  Any worsening of symptoms don't hesitate to be seen in person in urgent care or if severe in the ER.

## 2018-12-07 LAB — NOVEL CORONAVIRUS, NAA: SARS-CoV-2, NAA: NOT DETECTED

## 2019-05-06 ENCOUNTER — Encounter: Payer: Self-pay | Admitting: Emergency Medicine

## 2019-05-06 ENCOUNTER — Ambulatory Visit
Admission: EM | Admit: 2019-05-06 | Discharge: 2019-05-06 | Disposition: A | Discharge status: Home / Self Care | Payer: 59 | Attending: Family Medicine | Admitting: Family Medicine

## 2019-05-06 ENCOUNTER — Ambulatory Visit: Payer: 59

## 2019-05-06 ENCOUNTER — Other Ambulatory Visit: Payer: Self-pay

## 2019-05-06 ENCOUNTER — Other Ambulatory Visit: Payer: 59

## 2019-05-06 DIAGNOSIS — R509 Fever, unspecified: Secondary | ICD-10-CM | POA: Diagnosis present

## 2019-05-06 DIAGNOSIS — J029 Acute pharyngitis, unspecified: Secondary | ICD-10-CM | POA: Diagnosis present

## 2019-05-06 DIAGNOSIS — B349 Viral infection, unspecified: Secondary | ICD-10-CM

## 2019-05-06 HISTORY — DX: Depression, unspecified: F32.A

## 2019-05-06 LAB — POCT INFLUENZA A/B
Influenza A, POC: NEGATIVE
Influenza B, POC: NEGATIVE

## 2019-05-06 LAB — POCT RAPID STREP A (OFFICE): Rapid Strep A Screen: NEGATIVE

## 2019-05-06 MED ORDER — ONDANSETRON HCL 4 MG PO TABS: 4.0000 mg | ORAL_TABLET | Freq: Four times a day (QID) | ORAL | 0 refills | Status: AC

## 2019-05-06 NOTE — ED Provider Notes (Signed)
[facility]    CSN: [id number] Arrival date & time: 05/06/19  1215      History   Chief Complaint Chief Complaint  Patient presents with  . Fever  . Dizziness  . Sore Throat    HPI Victoria Dickerson is a 24 y.o. female.   Patient presents with fever, sore throat, dizziness x1 day.  She also reports one episode of emesis and 2 episodes of diarrhea today.  Tmax 101.6.  She has taken Advil for relief of her fever.  She had a rapid COVID test performed today which was negative.  She denies difficulty swallowing, cough, shortness of breath, rash, or other symptoms.   The history is provided by the patient.    Past Medical History:  Diagnosis Date  . Depression   . Seizure [facility])     Patient Active Problem List   Diagnosis Date Noted  . Seizure [facility])     History reviewed. No pertinent surgical history.  OB History   No obstetric history on file.      Home Medications    Prior to Admission medications   Medication Sig Start Date End Date Taking? Authorizing Provider  divalproex (DEPAKOTE) 500 MG DR tablet divalproex 500 mg tablet,delayed release 06/27/17  Yes [provider]  cetirizine (ZYRTEC) 10 MG tablet Take 10 mg by mouth daily.    [provider]  Dapsone (ACZONE) 5 % topical gel Aczone 5 % topical gel    [provider]  etonogestrel-ethinyl estradiol (NUVARING) 0.12-0.015 MG/24HR vaginal ring NuvaRing 0.12 mg-0.015 mg/24 hr vaginal  Insert 1 vaginal ring(s) every month by vaginal route.    [provider]  FLUoxetine (PROZAC) 20 MG capsule Take 20 mg by mouth daily.    [provider]  ondansetron (ZOFRAN) 4 MG tablet Take 1 tablet (4 mg total) by mouth every 6 (six) hours. 05/06/19   Sharion Balloon, NP  ZOLOFT 100 MG tablet  12/10/18   [provider]    Family History Family History  Problem Relation Age of Onset  . Healthy Mother   . Healthy Father     Social History Social History   Tobacco  Use  . Smoking status: Never Smoker  . Smokeless tobacco: Never Used  Substance Use Topics  . Alcohol use: Yes    Comment: every other weekend   . Drug use: No     Allergies   Patient has no known allergies.   Review of Systems Review of Systems  Constitutional: Positive for fever. Negative for chills.  HENT: Positive for sore throat. Negative for congestion, ear pain, rhinorrhea and trouble swallowing.   Eyes: Negative for pain and visual disturbance.  Respiratory: Negative for cough and shortness of breath.   Cardiovascular: Negative for chest pain and palpitations.  Gastrointestinal: Positive for diarrhea and vomiting. Negative for abdominal pain.  Genitourinary: Negative for dysuria and hematuria.  Musculoskeletal: Negative for arthralgias and back pain.  Skin: Negative for color change and rash.  Neurological: Positive for dizziness. Negative for seizures and syncope.  All other systems reviewed and are negative.    Physical Exam Triage Vital Signs ED Triage Vitals  Enc Vitals Group     BP      Pulse      Resp      Temp      Temp src      SpO2      Weight      Height  Head Circumference      Peak Flow      Pain Score      Pain Loc      Pain Edu?      Excl. in GC?    No data found.  Updated Vital Signs BP 105/70 (BP Location: Left Arm)   Pulse 98   Temp 98.7 F (37.1 C) (Oral)   Resp 18   Ht 5\' 7"  (1.702 m)   Wt 140 lb (63.5 kg)   LMP 04/29/2019   SpO2 99%   BMI 21.93 kg/m   Visual Acuity Right Eye Distance:   Left Eye Distance:   Bilateral Distance:    Right Eye Near:   Left Eye Near:    Bilateral Near:     Physical Exam Vitals and nursing note reviewed.  Constitutional:      General: She is not in acute distress.    Appearance: She is well-developed.  HENT:     Head: Normocephalic and atraumatic.     Right Ear: Tympanic membrane normal.     Left Ear: Tympanic membrane normal.     Nose: Nose normal.     Mouth/Throat:      Mouth: Mucous membranes are moist.     Pharynx: Posterior oropharyngeal erythema present.  Eyes:     Conjunctiva/sclera: Conjunctivae normal.  Cardiovascular:     Rate and Rhythm: Normal rate and regular rhythm.     Heart sounds: No murmur.  Pulmonary:     Effort: Pulmonary effort is normal. No respiratory distress.     Breath sounds: Normal breath sounds.  Abdominal:     General: Bowel sounds are normal.     Palpations: Abdomen is soft.     Tenderness: There is no abdominal tenderness. There is no guarding or rebound.  Musculoskeletal:     Cervical back: Neck supple.  Skin:    General: Skin is warm and dry.     Findings: No rash.  Neurological:     General: No focal deficit present.     Mental Status: She is alert and oriented to person, place, and time.  Psychiatric:        Mood and Affect: Mood normal.        Behavior: Behavior normal.      UC Treatments / Results  Labs (all labs ordered are listed, but only abnormal results are displayed) Labs Reviewed  NOVEL CORONAVIRUS, NAA  CULTURE, GROUP A STREP [facility])  POCT RAPID STREP A (OFFICE)  POCT INFLUENZA A/B    EKG   Radiology No results found.  Procedures Procedures (including critical care time)  Medications Ordered in UC Medications - No data to display  Initial Impression / Assessment and Plan / UC Course  I have reviewed the triage vital signs and the nursing notes.  Pertinent labs & imaging results that were available during my care of the patient were reviewed by me and considered in my medical decision making (see chart for details).   Viral Illness, fever, sore throat.  Rapid strep negative; throat culture pending.  Patient had rapid COVID done this morning elsewhere; PCR COVID performed here.  Rapid Flu negative.  Treating nausea with Zofran.  Instructed patient to take Tylenol as needed for fever or discomfort.  Instructed her to rest and stay hydrated.  Instructed her to go to the emergency  department if she develops high fever not relieved by Tylenol, shortness of breath, severe vomiting, severe diarrhea, or other concerning symptoms.  Patient  agrees to plan of care.   Final Clinical Impressions(s) / UC Diagnoses   Final diagnoses:  Fever, unspecified  Sore throat  Viral illness     Discharge Instructions     Your rapid strep test is negative.  A throat culture is pending; we will call you if it is positive requiring treatment.    Your rapid flu tests are negative.    Your COVID test is pending.  You should self quarantine until your test result is back and is negative.    Take Tylenol as needed for fever or discomfort.  Rest and keep yourself hydrated.    Go to the emergency department if you develop high fever, shortness of breath, severe diarrhea, or other concerning symptoms.       ED Prescriptions    Medication Sig Dispense Auth. Provider   ondansetron (ZOFRAN) 4 MG tablet Take 1 tablet (4 mg total) by mouth every 6 (six) hours. 12 tablet Mickie Bail, NP     PDMP not reviewed this encounter.   Mickie Bail, NP 05/06/19 [phone]

## 2019-05-06 NOTE — ED Triage Notes (Addendum)
Patient in office today c/o dizziness,fever(101.6) last night and this morning sorethroat  otc helped lower temperature.   Patient has been tested for covid rapid which was negative at [facility].  Would like a send out.   BVP:[id number]

## 2019-05-06 NOTE — Discharge Instructions (Addendum)
Your rapid strep test is negative.  A throat culture is pending; we will call you if it is positive requiring treatment.    Your rapid flu tests are negative.    Your COVID test is pending.  You should self quarantine until your test result is back and is negative.    Take Tylenol as needed for fever or discomfort.  Rest and keep yourself hydrated.    Go to the emergency department if you develop high fever, shortness of breath, severe diarrhea, or other concerning symptoms.

## 2019-05-07 LAB — NOVEL CORONAVIRUS, NAA: SARS-CoV-2, NAA: NOT DETECTED

## 2019-05-08 ENCOUNTER — Telehealth (HOSPITAL_COMMUNITY): Payer: Self-pay | Admitting: Emergency Medicine

## 2019-05-08 LAB — CULTURE, GROUP A STREP (THRC)

## 2019-05-08 NOTE — Telephone Encounter (Signed)
Strep culture shows not group A strep. Contacted pt by phone, she is improving. NO treatment indicated at this time.

## 2019-07-23 ENCOUNTER — Ambulatory Visit: Payer: 59

## 2019-08-21 ENCOUNTER — Encounter: Payer: Self-pay | Admitting: Medical
